# Patient Record
Sex: Female | Born: 1965
Health system: Southern US, Community
[De-identification: ages and names within clinical notes are randomized; demographics above are authoritative.]

## PROBLEM LIST (undated history)

## (undated) DIAGNOSIS — I10 Essential (primary) hypertension: Secondary | ICD-10-CM

## (undated) DIAGNOSIS — C801 Malignant (primary) neoplasm, unspecified: Secondary | ICD-10-CM

## (undated) HISTORY — PX: BREAST SURGERY: SHX581

---

## 1998-12-17 ENCOUNTER — Emergency Department (HOSPITAL_COMMUNITY): Admission: EM | Admit: 1998-12-17 | Discharge: 1998-12-17 | Payer: Self-pay | Admitting: *Deleted

## 1998-12-17 ENCOUNTER — Encounter: Payer: Self-pay | Admitting: *Deleted

## 1999-01-06 ENCOUNTER — Emergency Department (HOSPITAL_COMMUNITY): Admission: EM | Admit: 1999-01-06 | Discharge: 1999-01-06 | Payer: Self-pay | Admitting: Emergency Medicine

## 2000-11-04 ENCOUNTER — Emergency Department (HOSPITAL_COMMUNITY): Admission: EM | Admit: 2000-11-04 | Discharge: 2000-11-04 | Payer: Self-pay | Admitting: Emergency Medicine

## 2001-06-27 ENCOUNTER — Emergency Department (HOSPITAL_COMMUNITY): Admission: EM | Admit: 2001-06-27 | Discharge: 2001-06-27 | Payer: Self-pay | Admitting: *Deleted

## 2001-11-06 ENCOUNTER — Emergency Department (HOSPITAL_COMMUNITY): Admission: EM | Admit: 2001-11-06 | Discharge: 2001-11-06 | Payer: Self-pay | Admitting: Emergency Medicine

## 2003-04-27 ENCOUNTER — Emergency Department (HOSPITAL_COMMUNITY): Admission: EM | Admit: 2003-04-27 | Discharge: 2003-04-27 | Payer: Self-pay | Admitting: Emergency Medicine

## 2003-05-23 ENCOUNTER — Encounter: Payer: Self-pay | Admitting: Surgery

## 2003-05-23 ENCOUNTER — Encounter (HOSPITAL_COMMUNITY): Admission: RE | Admit: 2003-05-23 | Discharge: 2003-08-21 | Payer: Self-pay | Admitting: Surgery

## 2003-05-24 ENCOUNTER — Encounter: Payer: Self-pay | Admitting: Surgery

## 2003-05-30 ENCOUNTER — Encounter: Admission: RE | Admit: 2003-05-30 | Discharge: 2003-05-30 | Payer: Self-pay | Admitting: Surgery

## 2003-05-30 ENCOUNTER — Encounter: Payer: Self-pay | Admitting: Surgery

## 2003-05-31 ENCOUNTER — Encounter: Payer: Self-pay | Admitting: Surgery

## 2003-05-31 ENCOUNTER — Ambulatory Visit (HOSPITAL_BASED_OUTPATIENT_CLINIC_OR_DEPARTMENT_OTHER): Admission: RE | Admit: 2003-05-31 | Discharge: 2003-06-01 | Payer: Self-pay | Admitting: Surgery

## 2003-05-31 ENCOUNTER — Encounter (INDEPENDENT_AMBULATORY_CARE_PROVIDER_SITE_OTHER): Payer: Self-pay | Admitting: Specialist

## 2003-06-29 ENCOUNTER — Ambulatory Visit: Admission: RE | Admit: 2003-06-29 | Discharge: 2003-07-15 | Payer: Self-pay | Admitting: Radiation Oncology

## 2005-08-10 ENCOUNTER — Inpatient Hospital Stay (HOSPITAL_COMMUNITY): Admission: AD | Admit: 2005-08-10 | Discharge: 2005-08-10 | Payer: Self-pay | Admitting: Obstetrics & Gynecology

## 2006-08-26 ENCOUNTER — Inpatient Hospital Stay (HOSPITAL_COMMUNITY): Admission: AD | Admit: 2006-08-26 | Discharge: 2006-08-26 | Payer: Self-pay | Admitting: Gynecology

## 2006-11-03 ENCOUNTER — Emergency Department (HOSPITAL_COMMUNITY): Admission: EM | Admit: 2006-11-03 | Discharge: 2006-11-03 | Payer: Self-pay | Admitting: Emergency Medicine

## 2008-08-28 ENCOUNTER — Emergency Department (HOSPITAL_COMMUNITY): Admission: EM | Admit: 2008-08-28 | Discharge: 2008-08-28 | Payer: Self-pay | Admitting: Emergency Medicine

## 2008-12-03 ENCOUNTER — Emergency Department (HOSPITAL_COMMUNITY): Admission: EM | Admit: 2008-12-03 | Discharge: 2008-12-04 | Payer: Self-pay | Admitting: Emergency Medicine

## 2010-10-06 ENCOUNTER — Encounter: Payer: Self-pay | Admitting: Obstetrics

## 2011-01-31 NOTE — Op Note (Signed)
Tammie Pham, Tammie Pham                        ACCOUNT NO.:  [id number]   MEDICAL RECORD NO.:  [id number]                   PATIENT TYPE:  AMB   LOCATION:  DSC                                  FACILITY:  [facility]   PHYSICIAN:  Thornton Park. Daphine Deutscher, M.D.             DATE OF BIRTH:  22-Oct-1965   DATE OF PROCEDURE:  05/31/2003  DATE OF DISCHARGE:                                 OPERATIVE REPORT   CCS [phone]   PREOPERATIVE DIAGNOSIS:  Carcinoma of the right breast.   POSTOPERATIVE DIAGNOSIS:  Carcinoma of the right breast.   OPERATION PERFORMED:  Right axillary mapping and sentinel lymph node  biopsies (two), right breast quadrantectomy.   SURGEON:  Thornton Park. Daphine Deutscher, M.D.   ANESTHESIA:  General.   DESCRIPTION OF PROCEDURE:  Erby Pian underwent injection with  radionuclide at [facility] on the right breast.  She has previously  had a core biopsy by Dr. Jeralyn Ruths which proved to be invasive  adenocarcinoma and a palpable lesion in the right breast at approximately  the 12 o'clock position.  The scab from her previous core biopsy which lay  pretty much over this lesion was still visible and was marked  preoperatively.  In addition, you could feel the mass within the right  breast.  The breast was then mapped using the Neoprobe and there was a hot  spot seen along the lateral border of the pectoralis major muscle.  I then  injected beneath the nipple with a little Lymphazurin blue.   After prepping and draping with Betadine and sterile towels, I made an  incision about the lower portion of her hairline in the right axilla and  went in and found two sentinel nodes. These were identified, isolated and  the lymphatic and vascular attachments were carefully clipped with Hemoclips  and they were removed and proved to be blue and hot.  One was more inferior  and superficial.  The other was higher in the axilla and deeper.  Each had  counts over 2000 and 3000 and in the  background, remnant noise was in the  100 to 200 range.   I then put a moist lap pad in the axilla and went up and performed the  quadrantectomy.  I outlined an elliptical incision that included skin from  the previous biopsy in the middle as well as the core biopsy site.  I  carried this through the dermis and then raised flaps superiorly and  inferiorly, medial and lateral and went down to the chest wall.  Inferiorly,  I went down below the nipple or to the level in the retroareolar space and  got a generous quadrantectomy size biopsy down to the chest wall.  This  seemed to completely encompass the lesion and more particularly the medial  radial nippleward margin.  I marked it with metallic markers to demonstrate  its location.  I sent it for  specimen mammography by Dr. Yolanda Bonine which  showed the lesion was well within the mammogram and then this was sent for  permanent sections.   In the meantime, I irrigated with saline.  I changed my gloves.  I instilled  some lidocaine into all of the wounds and enclosed them with 4-0 Vicryl  subcutaneously with benzoin and Steri-Strips on the skin.  The patient  seemed to tolerate the procedure well and was taken to the recovery room in  satisfactory condition.  She will be kept for overnight observation.   FINAL DIAGNOSIS:  Invasive carcinoma of the right breast, status post  sentinel lymph node mapping and right breast quadrantectomy.                                               Thornton Park Daphine Deutscher, M.D.    MBM/MEDQ  D:  05/31/2003  T:  05/31/2003  Job:  [id number]   cc:   Renaye Rakers, M.D.  [phone] [address], [address]  [place]  [place] [address]  Fax: [phone]   Dr. Jeralyn Ruths

## 2012-08-11 ENCOUNTER — Emergency Department (HOSPITAL_COMMUNITY): Payer: No Typology Code available for payment source

## 2012-08-11 ENCOUNTER — Encounter (HOSPITAL_COMMUNITY): Payer: Self-pay | Admitting: Neurology

## 2012-08-11 ENCOUNTER — Emergency Department (HOSPITAL_COMMUNITY)
Admission: EM | Admit: 2012-08-11 | Discharge: 2012-08-11 | Disposition: A | Discharge status: Home / Self Care | Payer: No Typology Code available for payment source | Attending: Emergency Medicine | Admitting: Emergency Medicine

## 2012-08-11 DIAGNOSIS — S0990XA Unspecified injury of head, initial encounter: Secondary | ICD-10-CM

## 2012-08-11 DIAGNOSIS — S139XXA Sprain of joints and ligaments of unspecified parts of neck, initial encounter: Secondary | ICD-10-CM | POA: Insufficient documentation

## 2012-08-11 DIAGNOSIS — S0993XA Unspecified injury of face, initial encounter: Secondary | ICD-10-CM | POA: Insufficient documentation

## 2012-08-11 DIAGNOSIS — S161XXA Strain of muscle, fascia and tendon at neck level, initial encounter: Secondary | ICD-10-CM

## 2012-08-11 DIAGNOSIS — Y93I9 Activity, other involving external motion: Secondary | ICD-10-CM | POA: Insufficient documentation

## 2012-08-11 DIAGNOSIS — R42 Dizziness and giddiness: Secondary | ICD-10-CM | POA: Insufficient documentation

## 2012-08-11 DIAGNOSIS — R51 Headache: Secondary | ICD-10-CM | POA: Insufficient documentation

## 2012-08-11 DIAGNOSIS — S20219A Contusion of unspecified front wall of thorax, initial encounter: Secondary | ICD-10-CM | POA: Insufficient documentation

## 2012-08-11 DIAGNOSIS — S335XXA Sprain of ligaments of lumbar spine, initial encounter: Secondary | ICD-10-CM | POA: Insufficient documentation

## 2012-08-11 DIAGNOSIS — Y92410 Unspecified street and highway as the place of occurrence of the external cause: Secondary | ICD-10-CM | POA: Insufficient documentation

## 2012-08-11 DIAGNOSIS — S39012A Strain of muscle, fascia and tendon of lower back, initial encounter: Secondary | ICD-10-CM

## 2012-08-11 DIAGNOSIS — IMO0002 Reserved for concepts with insufficient information to code with codable children: Secondary | ICD-10-CM | POA: Insufficient documentation

## 2012-08-11 HISTORY — DX: Malignant (primary) neoplasm, unspecified: C80.1

## 2012-08-11 MED ORDER — ONDANSETRON 4 MG PO TBDP
8.0000 mg | ORAL_TABLET | Freq: Once | ORAL | Status: AC
  Administered 2012-08-11: 8 mg via ORAL
  Filled 2012-08-11: qty 2

## 2012-08-11 MED ORDER — IBUPROFEN 200 MG PO TABS
600.0000 mg | ORAL_TABLET | Freq: Once | ORAL | Status: AC
  Administered 2012-08-11: 600 mg via ORAL
  Filled 2012-08-11: qty 3

## 2012-08-11 MED ORDER — NAPROXEN 500 MG PO TABS: 500.0000 mg | ORAL_TABLET | Freq: Two times a day (BID) | ORAL | Status: DC

## 2012-08-11 MED ORDER — HYDROCODONE-ACETAMINOPHEN 5-500 MG PO TABS: 1.0000 | ORAL_TABLET | Freq: Four times a day (QID) | ORAL | Status: DC | PRN

## 2012-08-11 MED ORDER — CYCLOBENZAPRINE HCL 10 MG PO TABS: 10.0000 mg | ORAL_TABLET | Freq: Two times a day (BID) | ORAL | Status: DC | PRN

## 2012-08-11 MED ORDER — HYDROCODONE-ACETAMINOPHEN 5-325 MG PO TABS
1.0000 | ORAL_TABLET | Freq: Once | ORAL | Status: AC
  Administered 2012-08-11: 1 via ORAL
  Filled 2012-08-11: qty 1
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
## 2012-08-11 NOTE — ED Notes (Signed)
Pt reporting nausea. Sprite and crackers provided. PA made aware.
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
## 2012-08-12 NOTE — ED Provider Notes (Signed)
Medical screening examination/treatment/procedure(s) were performed by non-physician practitioner and as supervising physician I was immediately available for consultation/collaboration.   Hurman Horn, MD 08/12/12 [phone]

## 2013-11-09 ENCOUNTER — Telehealth: Payer: Self-pay

## 2013-11-09 NOTE — Telephone Encounter (Signed)
Called and spoke to patient and asked her about appt with Dr.Yan Patient states she is not going to any appt right now. I asked to speak to her husband she told me he was sleeping.

## 2013-11-28 ENCOUNTER — Emergency Department (HOSPITAL_COMMUNITY)
Admission: EM | Admit: 2013-11-28 | Discharge: 2013-11-28 | Disposition: A | Discharge status: Home / Self Care | Payer: BC Managed Care – PPO | Attending: Emergency Medicine | Admitting: Emergency Medicine

## 2013-11-28 ENCOUNTER — Encounter (HOSPITAL_COMMUNITY): Payer: Self-pay | Admitting: Emergency Medicine

## 2013-11-28 DIAGNOSIS — R51 Headache: Secondary | ICD-10-CM

## 2013-11-28 DIAGNOSIS — J329 Chronic sinusitis, unspecified: Secondary | ICD-10-CM | POA: Insufficient documentation

## 2013-11-28 DIAGNOSIS — Z859 Personal history of malignant neoplasm, unspecified: Secondary | ICD-10-CM | POA: Insufficient documentation

## 2013-11-28 DIAGNOSIS — H6120 Impacted cerumen, unspecified ear: Secondary | ICD-10-CM | POA: Insufficient documentation

## 2013-11-28 DIAGNOSIS — Z79899 Other long term (current) drug therapy: Secondary | ICD-10-CM | POA: Insufficient documentation

## 2013-11-28 DIAGNOSIS — R519 Headache, unspecified: Secondary | ICD-10-CM

## 2013-11-28 DIAGNOSIS — I10 Essential (primary) hypertension: Secondary | ICD-10-CM | POA: Insufficient documentation

## 2013-11-28 DIAGNOSIS — Z3202 Encounter for pregnancy test, result negative: Secondary | ICD-10-CM | POA: Insufficient documentation

## 2013-11-28 DIAGNOSIS — R3915 Urgency of urination: Secondary | ICD-10-CM | POA: Insufficient documentation

## 2013-11-28 HISTORY — DX: Essential (primary) hypertension: I10

## 2013-11-28 LAB — URINALYSIS, ROUTINE W REFLEX MICROSCOPIC
Bilirubin Urine: NEGATIVE
Glucose, UA: NEGATIVE mg/dL
Hgb urine dipstick: NEGATIVE
Ketones, ur: NEGATIVE mg/dL
Leukocytes, UA: NEGATIVE
Nitrite: NEGATIVE
Protein, ur: NEGATIVE mg/dL
Specific Gravity, Urine: 1.013 (ref 1.005–1.030)
Urobilinogen, UA: 0.2 mg/dL (ref 0.0–1.0)
pH: 7.5 (ref 5.0–8.0)

## 2013-11-28 LAB — CBC
HCT: 36.6 % (ref 36.0–46.0)
Hemoglobin: 12.5 g/dL (ref 12.0–15.0)
MCH: 31.3 pg (ref 26.0–34.0)
MCHC: 34.2 g/dL (ref 30.0–36.0)
MCV: 91.7 fL (ref 78.0–100.0)
Platelets: 417 10*3/uL — ABNORMAL HIGH (ref 150–400)
RBC: 3.99 MIL/uL (ref 3.87–5.11)
RDW: 13.4 % (ref 11.5–15.5)
WBC: 5.4 10*3/uL (ref 4.0–10.5)

## 2013-11-28 LAB — BASIC METABOLIC PANEL
BUN: 9 mg/dL (ref 6–23)
CO2: 27 meq/L (ref 19–32)
Calcium: 9.7 mg/dL (ref 8.4–10.5)
Chloride: 98 meq/L (ref 96–112)
Creatinine, Ser: 0.64 mg/dL (ref 0.50–1.10)
GFR calc Af Amer: >90 mL/min (ref >90)
GFR calc non Af Amer: >90 mL/min (ref >90)
Glucose, Bld: 109 mg/dL — ABNORMAL HIGH (ref 70–99)
Potassium: 3.9 meq/L (ref 3.7–5.3)
Sodium: 137 meq/L (ref 137–147)

## 2013-11-28 LAB — POC URINE PREG, ED: Preg Test, Ur: NEGATIVE

## 2013-11-28 MED ORDER — IBUPROFEN 800 MG PO TABS: 800.0000 mg | ORAL_TABLET | Freq: Three times a day (TID) | ORAL | Status: DC

## 2013-11-28 MED ORDER — SODIUM CHLORIDE 0.9 % IV BOLUS (SEPSIS)
500.0000 mL | Freq: Once | INTRAVENOUS | Status: AC
  Administered 2013-11-28: 500 mL via INTRAVENOUS

## 2013-11-28 MED ORDER — KETOROLAC TROMETHAMINE 30 MG/ML IJ SOLN
30.0000 mg | Freq: Once | INTRAMUSCULAR | Status: AC
  Administered 2013-11-28: 30 mg via INTRAVENOUS
  Filled 2013-11-28: qty 1

## 2013-11-28 MED ORDER — DEXAMETHASONE SODIUM PHOSPHATE 10 MG/ML IJ SOLN
10.0000 mg | Freq: Once | INTRAMUSCULAR | Status: AC
  Administered 2013-11-28: 10 mg via INTRAVENOUS
  Filled 2013-11-28: qty 1

## 2013-11-28 MED ORDER — KETOROLAC TROMETHAMINE 30 MG/ML IJ SOLN: 15.0000 mg | Freq: Once | INTRAMUSCULAR | Status: DC

## 2013-11-28 MED ORDER — FLUTICASONE PROPIONATE 50 MCG/ACT NA SUSP: 2.0000 | Freq: Every day | NASAL | Status: DC

## 2013-11-28 MED ORDER — OXYMETAZOLINE HCL 0.05 % NA SOLN: 1.0000 | Freq: Two times a day (BID) | NASAL | Status: DC

## 2013-11-28 NOTE — Discharge Instructions (Signed)
Call for a follow up appointment with a Family or Primary Care Provider for further evaluation of your hypertension, and cerumen impaction (ear wax build up). Follow up with Dr. Krista Blue, the neurologist for further evaluation of your headaches. Monitor your Blood Pressure at home. Debrox (over the counter) for ear wax after you see your PCP for removal. Return if Symptoms worsen.   Take medication as prescribed.

## 2013-11-28 NOTE — ED Notes (Addendum)
Pt reports headache that started this morning, pt sts she then went to [facility] To have her blood pressure checked and they told her it was high. Pt sts she was recently diagnosed with hypertension and is taking medications but is still having headaches every day. Pt also sts she thinks she could be having kidney infection because in January she was told she is having blood in urine.

## 2013-11-28 NOTE — ED Provider Notes (Signed)
CSN: [id number]     Arrival date & time 11/28/13  1020 History   First MD Initiated Contact with Patient 11/28/13 1101     Chief Complaint  Patient presents with  . Headache     (Consider location/radiation/quality/duration/timing/severity/associated sxs/prior Treatment) HPI Comments: Tammie Pham is a 48 y.o. female with a past medical history of HTN presenting the Emergency Department with a chief complaint of headache.  The patient reports a gradual onset of frontal headache since this morning.  She describes the discomfort as pressure, rated 9/10.  She reports similar headaches over the past several months, they are aggravated by leaning her head forward or backShe denies visual changes, photophobia, numbness, tingling, lightheadedness, dizziness, rash, or fever.She also reports urinary urgency for several days, denies dysuria, hematuria, abdominal pain, nausea, vomiting.  Reports recent diagnosis of HTN and reports compliance with Lasix and Norvasc.  Does not monitor BP at home and does not know her baseline BP. PCP: Lindwood Qua  Patient is a 48 y.o. female presenting with headaches. The history is provided by the patient and medical records. No language interpreter was used.  Headache Associated symptoms: congestion and sinus pressure   Associated symptoms: no abdominal pain, no diarrhea, no dizziness, no fever, no nausea, no numbness, no photophobia and no vomiting     Past Medical History  Diagnosis Date  . Cancer   . Hypertension    Past Surgical History  Procedure Laterality Date  . Breast surgery     No family history on file. History  Substance Use Topics  . Smoking status: Never Smoker   . Smokeless tobacco: Not on file  . Alcohol Use: No   OB History   Grav Para Term Preterm Abortions TAB SAB Ect Mult Living                 Review of Systems  Constitutional: Negative for fever and chills.  HENT: Positive for congestion and sinus pressure.   Eyes: Negative  for photophobia and visual disturbance.  Gastrointestinal: Negative for nausea, vomiting, abdominal pain and diarrhea.  Genitourinary: Positive for urgency. Negative for dysuria, hematuria and flank pain.  Skin: Negative for rash.  Neurological: Positive for headaches. Negative for dizziness, syncope, weakness, light-headedness and numbness.  All other systems reviewed and are negative.      Allergies  Codeine  Home Medications   Current Outpatient Rx  Name  Route  Sig  Dispense  Refill  . amLODipine (NORVASC) 10 MG tablet   Oral   Take 10 mg by mouth daily.         . furosemide (LASIX) 20 MG tablet   Oral   Take 20 mg by mouth daily.         . Multiple Vitamin (MULTIVITAMIN WITH MINERALS) TABS tablet   Oral   Take 1 tablet by mouth daily.          BP 138/107  Pulse 117  Temp(Src) 98.3 F (36.8 C) (Oral)  Resp 20  SpO2 99%  LMP 11/14/2013 Physical Exam  Nursing note and vitals reviewed. Constitutional: She is oriented to person, place, and time. She appears well-developed and well-nourished. She is active.  Non-toxic appearance. She does not have a sickly appearance. No distress.  HENT:  Head: Normocephalic and atraumatic.  Nose: Rhinorrhea present. Right sinus exhibits frontal sinus tenderness. Left sinus exhibits frontal sinus tenderness.  Mouth/Throat: Oropharynx is clear and moist.  Cerumen impaction bilaterally.  Unable to visualize TMs.  Eyes:  Conjunctivae and EOM are normal. Pupils are equal, round, and reactive to light. No scleral icterus.  Neck: Normal range of motion. Neck supple. No rigidity.  Cardiovascular: Normal rate, regular rhythm and normal heart sounds.   Pulmonary/Chest: Effort normal and breath sounds normal. No respiratory distress. She has no decreased breath sounds. She has no wheezes. She has no rales.  Abdominal: Soft. Bowel sounds are normal. There is no tenderness. There is no rebound and no guarding.  Musculoskeletal: Normal range  of motion. She exhibits no edema.  Lymphadenopathy:       Head (right side): No submental, no submandibular, no tonsillar, no preauricular, no posterior auricular and no occipital adenopathy present.       Head (left side): No submental, no submandibular, no tonsillar, no preauricular, no posterior auricular and no occipital adenopathy present.    She has no cervical adenopathy.  Neurological: She is alert and oriented to person, place, and time. No cranial nerve deficit or sensory deficit. She exhibits normal muscle tone. Coordination normal. GCS eye subscore is 4. GCS verbal subscore is 5. GCS motor subscore is 6.  Speech is clear and goal oriented, follows commands Cranial nerves III - XII grossly intact, no facial droop Normal strength in upper and lower extremities bilaterally, strong and equal grip strength Sensation normal to light touch Moves all 4 extremities without ataxia, normal gait, coordination intact     Skin: Skin is warm and dry. No rash noted. She is not diaphoretic.  Psychiatric: She has a normal mood and affect. Her behavior is normal. Thought content normal.    ED Course  Procedures (including critical care time) Labs Review Labs Reviewed  CBC - Abnormal; Notable for the following:    Platelets 417 (*)    All other components within normal limits  BASIC METABOLIC PANEL - Abnormal; Notable for the following:    Glucose, Bld 109 (*)    All other components within normal limits  URINALYSIS, ROUTINE W REFLEX MICROSCOPIC  POC URINE PREG, ED   Imaging Review No results found.   EKG Interpretation None      MDM   Final diagnoses:  Headache  Sinusitis  Hypertension  Cerumen impaction   Pt presents with gradual onset of headache, similar to previous HA in the past. No neuro focal deficits on exam. PE shows sinus tenderness to palpation, cerumen impaction.  Hypertension on arrival  160/116.  EMR shows patient has been referred to Neurology for further  evaluation of her ongoing headaches. 1459: BP 138/107 Labs WNL.  Headache likely sinusitis.  Re-eval pt resting comfortably in room watching TV pt reports symptom improvement requesting food.  Encouraged monitoring BP at home and following up with her PCP for further management. 4782: Re-eval pt watching TV in room eating sandwich reports 6/10 discomfort. Decadron ordered. 1655: Pt care signed out to Pt-care assumed by Jeannett Senior, PA-C.  Meds given in ED:  Medications  dexamethasone (DECADRON) injection 10 mg (not administered)  sodium chloride 0.9 % bolus 500 mL (500 mLs Intravenous New Bag/Given 11/28/13 1458)  ketorolac (TORADOL) 30 MG/ML injection 30 mg (30 mg Intravenous Given 11/28/13 1517)    New Prescriptions   FLUTICASONE (FLONASE) 50 MCG/ACT NASAL SPRAY    Place 2 sprays into both nostrils daily.   IBUPROFEN (ADVIL,MOTRIN) 800 MG TABLET    Take 1 tablet (800 mg total) by mouth 3 (three) times daily. Take with meals   OXYMETAZOLINE (AFRIN NASAL SPRAY) 0.05 % NASAL SPRAY  Place 1 spray into both nostrils 2 (two) times daily.       Lorrine Kin, PA-C 11/28/13 [place], PA-C 11/28/13 [phone]

## 2013-11-28 NOTE — ED Provider Notes (Signed)
Patient signed out to me at shift change. Patient presented to emergency department complaining of headache and elevated blood pressure. Initial blood pressure reading was 160/116. Patient is followed by primary care Dr., and has referral to a neurologist however she has not seen him yet. She reports ongoing headaches over several months. Her exam is unremarkable. Patient was treated emergency department with Toradol IV and Decadron IV. She is feeling much better now. Her headache has resolved. She reports to me some sinus pressure and nasal and ear congestion. On exam her bilateral ears are impacted with cerumen. She does have nasal congestion tenderness over her maxillary sinuses bilaterally. Her exam is otherwise unremarkable. Patient will be discharged home with treatment for sinusitis and close followup with primary care Dr. any urologist. Her blood pressure currently is 138/84, no medications given in emergency department for her blood pressure.  Filed Vitals:   11/28/13 1659  BP: 134/84  Pulse: 73  Temp: 98.7 F (37.1 C)  Resp:      Renold Genta, PA-C 11/28/13 1737

## 2013-11-28 NOTE — ED Provider Notes (Signed)
Medical screening examination/treatment/procedure(s) were performed by non-physician practitioner and as supervising physician I was immediately available for consultation/collaboration.   EKG Interpretation None       Merryl Hacker, MD 11/28/13 [phone]

## 2013-11-30 ENCOUNTER — Other Ambulatory Visit (HOSPITAL_COMMUNITY): Payer: Self-pay | Admitting: Family Medicine

## 2013-11-30 DIAGNOSIS — I499 Cardiac arrhythmia, unspecified: Secondary | ICD-10-CM

## 2013-11-30 DIAGNOSIS — I10 Essential (primary) hypertension: Secondary | ICD-10-CM

## 2013-12-01 ENCOUNTER — Other Ambulatory Visit (HOSPITAL_COMMUNITY): Payer: BC Managed Care – PPO

## 2013-12-07 ENCOUNTER — Other Ambulatory Visit (HOSPITAL_COMMUNITY): Payer: BC Managed Care – PPO

## 2014-08-01 ENCOUNTER — Emergency Department (HOSPITAL_COMMUNITY)
Admission: EM | Admit: 2014-08-01 | Discharge: 2014-08-01 | Disposition: A | Discharge status: Home / Self Care | Payer: BC Managed Care – PPO | Attending: Emergency Medicine | Admitting: Emergency Medicine

## 2014-08-01 DIAGNOSIS — Z859 Personal history of malignant neoplasm, unspecified: Secondary | ICD-10-CM | POA: Insufficient documentation

## 2014-08-01 DIAGNOSIS — R531 Weakness: Secondary | ICD-10-CM | POA: Diagnosis not present

## 2014-08-01 DIAGNOSIS — R55 Syncope and collapse: Secondary | ICD-10-CM | POA: Insufficient documentation

## 2014-08-01 DIAGNOSIS — Z791 Long term (current) use of non-steroidal anti-inflammatories (NSAID): Secondary | ICD-10-CM | POA: Insufficient documentation

## 2014-08-01 DIAGNOSIS — I10 Essential (primary) hypertension: Secondary | ICD-10-CM | POA: Diagnosis not present

## 2014-08-01 DIAGNOSIS — Z79899 Other long term (current) drug therapy: Secondary | ICD-10-CM | POA: Diagnosis not present

## 2014-08-01 DIAGNOSIS — Z7951 Long term (current) use of inhaled steroids: Secondary | ICD-10-CM | POA: Diagnosis not present

## 2014-08-01 LAB — CBC WITH DIFFERENTIAL/PLATELET
Basophils Absolute: 0 10*3/uL (ref 0.0–0.1)
Basophils Relative: 1 % (ref 0–1)
Eosinophils Absolute: 0.2 10*3/uL (ref 0.0–0.7)
Eosinophils Relative: 4 % (ref 0–5)
HCT: 35.8 % — ABNORMAL LOW (ref 36.0–46.0)
Hemoglobin: 11.9 g/dL — ABNORMAL LOW (ref 12.0–15.0)
Lymphocytes Relative: 28 % (ref 12–46)
Lymphs Abs: 1.4 10*3/uL (ref 0.7–4.0)
MCH: 30.3 pg (ref 26.0–34.0)
MCHC: 33.2 g/dL (ref 30.0–36.0)
MCV: 91.1 fL (ref 78.0–100.0)
Monocytes Absolute: 0.6 10*3/uL (ref 0.1–1.0)
Monocytes Relative: 11 % (ref 3–12)
Neutro Abs: 2.9 10*3/uL (ref 1.7–7.7)
Neutrophils Relative %: 56 % (ref 43–77)
Platelets: 331 10*3/uL (ref 150–400)
RBC: 3.93 MIL/uL (ref 3.87–5.11)
RDW: 12.9 % (ref 11.5–15.5)
WBC: 5.2 10*3/uL (ref 4.0–10.5)

## 2014-08-01 LAB — COMPREHENSIVE METABOLIC PANEL
ALT: 10 U/L (ref 0–35)
AST: 14 U/L (ref 0–37)
Albumin: 4.1 g/dL (ref 3.5–5.2)
Alkaline Phosphatase: 70 U/L (ref 39–117)
Anion gap: 14 (ref 5–15)
BUN: 23 mg/dL (ref 6–23)
CO2: 26 mEq/L (ref 19–32)
Calcium: 10.3 mg/dL (ref 8.4–10.5)
Chloride: 98 meq/L (ref 96–112)
Creatinine, Ser: 0.84 mg/dL (ref 0.50–1.10)
GFR calc Af Amer: >90 mL/min (ref >90)
GFR calc non Af Amer: 80 mL/min — ABNORMAL LOW (ref >90)
Glucose, Bld: 98 mg/dL (ref 70–99)
Potassium: 4.1 mEq/L (ref 3.7–5.3)
Sodium: 138 mEq/L (ref 137–147)
Total Bilirubin: 1 mg/dL (ref 0.3–1.2)
Total Protein: 8.7 g/dL — ABNORMAL HIGH (ref 6.0–8.3)

## 2014-08-01 LAB — TSH: TSH: 1.55 u[IU]/mL (ref 0.350–4.500)

## 2014-08-01 LAB — URINALYSIS, ROUTINE W REFLEX MICROSCOPIC
Bilirubin Urine: NEGATIVE
Glucose, UA: NEGATIVE mg/dL
Ketones, ur: NEGATIVE mg/dL
Leukocytes, UA: NEGATIVE
Nitrite: NEGATIVE
Protein, ur: NEGATIVE mg/dL
Specific Gravity, Urine: 1.02 (ref 1.005–1.030)
Urobilinogen, UA: 0.2 mg/dL (ref 0.0–1.0)
pH: 6 (ref 5.0–8.0)

## 2014-08-01 LAB — URINE MICROSCOPIC-ADD ON

## 2014-08-01 NOTE — ED Notes (Signed)
Pt presents with NAD- Pt states " about passed out yesterday" "been having hot flashes for about 3 weeks" EDP Delo evaluated this pt before this Probation officer.

## 2014-08-01 NOTE — ED Notes (Signed)
MD at bedside. 

## 2014-08-01 NOTE — Discharge Instructions (Signed)
Follow-up with your primary Dr. If not improving in the next week and return to the ER if your symptoms substantially worsen or change.   Near-Syncope Near-syncope (commonly known as near fainting) is sudden weakness, dizziness, or feeling like you might pass out. During an episode of near-syncope, you may also develop pale skin, have tunnel vision, or feel sick to your stomach (nauseous). Near-syncope may occur when getting up after sitting or while standing for a long time. It is caused by a sudden decrease in blood flow to the brain. This decrease can result from various causes or triggers, most of which are not serious. However, because near-syncope can sometimes be a sign of something serious, a medical evaluation is required. The specific cause is often not determined. HOME CARE INSTRUCTIONS  Monitor your condition for any changes. The following actions may help to alleviate any discomfort you are experiencing:  Have someone stay with you until you feel stable.  Lie down right away and prop your feet up if you start feeling like you might faint. Breathe deeply and steadily. Wait until all the symptoms have passed. Most of these episodes last only a few minutes. You may feel tired for several hours.   Drink enough fluids to keep your urine clear or pale yellow.   If you are taking blood pressure or heart medicine, get up slowly when seated or lying down. Take several minutes to sit and then stand. This can reduce dizziness.  Follow up with your health care provider as directed. SEEK IMMEDIATE MEDICAL CARE IF:   You have a severe headache.   You have unusual pain in the chest, abdomen, or back.   You are bleeding from the mouth or rectum, or you have black or tarry stool.   You have an irregular or very fast heartbeat.   You have repeated fainting or have seizure-like jerking during an episode.   You faint when sitting or lying down.   You have confusion.   You have  difficulty walking.   You have severe weakness.   You have vision problems.  MAKE SURE YOU:   Understand these instructions.  Will watch your condition.  Will get help right away if you are not doing well or get worse. Document Released: 09/01/2005 Document Revised: 09/06/2013 Document Reviewed: 02/04/2013 [facility] Patient Information 2015 [place], [place]. This information is not intended to replace advice given to you by your health care provider. Make sure you discuss any questions you have with your health care provider.

## 2014-08-01 NOTE — ED Provider Notes (Signed)
CSN: [id number]     Arrival date & time 08/01/14  0719 History   First MD Initiated Contact with Patient 08/01/14 [phone]     No chief complaint on file.    (Consider location/radiation/quality/duration/timing/severity/associated sxs/prior Treatment) HPI Comments: Patient is a 48 year old female with history of hypertension. She presents with complaints of intermittent weakness and dizziness that has been occurring for the past 3 weeks. She reports "hot flashes" and felt this was related to menopause. She is now feeling dizziness and near syncope on occasion and is concerned about this. She denies any fevers or chills. She denies any nausea, vomiting, or diarrhea.  Patient is a 48 y.o. female presenting with weakness. The history is provided by the patient.  Weakness This is a new problem. Episode onset: 3 weeks ago. The problem has been gradually worsening. Pertinent negatives include no chest pain, no abdominal pain, no headaches and no shortness of breath. Nothing aggravates the symptoms. Nothing relieves the symptoms. She has tried nothing for the symptoms. The treatment provided no relief.    Past Medical History  Diagnosis Date  . Cancer   . Hypertension    Past Surgical History  Procedure Laterality Date  . Breast surgery     No family history on file. History  Substance Use Topics  . Smoking status: Never Smoker   . Smokeless tobacco: Not on file  . Alcohol Use: No   OB History    No data available     Review of Systems  Respiratory: Negative for shortness of breath.   Cardiovascular: Negative for chest pain.  Gastrointestinal: Negative for abdominal pain.  Neurological: Positive for weakness. Negative for headaches.  All other systems reviewed and are negative.     Allergies  Codeine  Home Medications   Prior to Admission medications   Medication Sig Start Date End Date Taking? Authorizing Provider  amLODipine (NORVASC) 10 MG tablet Take 10 mg by mouth  daily.    Historical Provider, MD  fluticasone (FLONASE) 50 MCG/ACT nasal spray Place 2 sprays into both nostrils daily. 11/28/13   Harvie Heck, PA-C  furosemide (LASIX) 20 MG tablet Take 20 mg by mouth daily.    Historical Provider, MD  ibuprofen (ADVIL,MOTRIN) 800 MG tablet Take 1 tablet (800 mg total) by mouth 3 (three) times daily. Take with meals 11/28/13   Harvie Heck, PA-C  Multiple Vitamin (MULTIVITAMIN WITH MINERALS) TABS tablet Take 1 tablet by mouth daily.    Historical Provider, MD  oxymetazoline (AFRIN NASAL SPRAY) 0.05 % nasal spray Place 1 spray into both nostrils 2 (two) times daily. 11/28/13   Lauren Parker, PA-C   BP 120/72 mmHg  Pulse 81  Temp(Src) 97.8 F (36.6 C) (Oral)  Resp 16  SpO2 100% Physical Exam  Constitutional: She is oriented to person, place, and time. She appears well-developed and well-nourished. No distress.  HENT:  Head: Normocephalic and atraumatic.  Mouth/Throat: Oropharynx is clear and moist.  Eyes: EOM are normal. Pupils are equal, round, and reactive to light.  Neck: Normal range of motion. Neck supple.  Cardiovascular: Normal rate and regular rhythm.  Exam reveals no gallop and no friction rub.   No murmur heard. Pulmonary/Chest: Effort normal and breath sounds normal. No respiratory distress. She has no wheezes.  Abdominal: Soft. Bowel sounds are normal. She exhibits no distension. There is no tenderness.  Musculoskeletal: Normal range of motion.  Neurological: She is alert and oriented to person, place, and time. No cranial nerve deficit. She exhibits  normal muscle tone. Coordination normal.  Skin: Skin is warm and dry. She is not diaphoretic.  Nursing note and vitals reviewed.   ED Course  Procedures (including critical care time) Labs Review Labs Reviewed  COMPREHENSIVE METABOLIC PANEL  CBC WITH DIFFERENTIAL  TSH  URINALYSIS, ROUTINE W REFLEX MICROSCOPIC    Imaging Review No results found.   EKG Interpretation   Date/Time:   Tuesday August 01 2014 07:43:40 EST Ventricular Rate:  83 PR Interval:  171 QRS Duration: 80 QT Interval:  356 QTC Calculation: 418 R Axis:   75 Text Interpretation:  Sinus rhythm Consider left ventricular hypertrophy  Borderline T abnormalities, anterior leads Confirmed by DELOS  MD, Andyn Sales  ([id number]) on 08/01/2014 9:29:06 AM      MDM   Final diagnoses:  None    Patient is a 48 year old female who presents with complaints of near syncope for the past couple days along with "hot flashes" for the past 3 weeks. Her periods are erratic and she believes she may be going through menopause. Nothing else in the workup suggests a cause. Her electrolytes and blood counts are unremarkable an EKG is normal. A TSH is pending. Nothing appears emergent and I believe she is appropriate for discharge. She is to follow-up with her primary Dr. If not improving in the next week.    Veryl Speak, MD 08/01/14 0930

## 2014-12-18 ENCOUNTER — Encounter (HOSPITAL_COMMUNITY): Payer: Self-pay | Admitting: Emergency Medicine

## 2014-12-18 ENCOUNTER — Emergency Department (HOSPITAL_COMMUNITY): Payer: BLUE CROSS/BLUE SHIELD

## 2014-12-18 ENCOUNTER — Emergency Department (HOSPITAL_COMMUNITY)
Admission: EM | Admit: 2014-12-18 | Discharge: 2014-12-18 | Disposition: A | Discharge status: Home / Self Care | Payer: BLUE CROSS/BLUE SHIELD | Attending: Emergency Medicine | Admitting: Emergency Medicine

## 2014-12-18 DIAGNOSIS — R079 Chest pain, unspecified: Secondary | ICD-10-CM | POA: Diagnosis not present

## 2014-12-18 DIAGNOSIS — I10 Essential (primary) hypertension: Secondary | ICD-10-CM | POA: Diagnosis not present

## 2014-12-18 DIAGNOSIS — Z79899 Other long term (current) drug therapy: Secondary | ICD-10-CM | POA: Diagnosis not present

## 2014-12-18 DIAGNOSIS — Z8589 Personal history of malignant neoplasm of other organs and systems: Secondary | ICD-10-CM | POA: Insufficient documentation

## 2014-12-18 LAB — CBC
HCT: 35.3 % — ABNORMAL LOW (ref 36.0–46.0)
Hemoglobin: 11.8 g/dL — ABNORMAL LOW (ref 12.0–15.0)
MCH: 30.6 pg (ref 26.0–34.0)
MCHC: 33.4 g/dL (ref 30.0–36.0)
MCV: 91.7 fL (ref 78.0–100.0)
Platelets: 366 10*3/uL (ref 150–400)
RBC: 3.85 MIL/uL — ABNORMAL LOW (ref 3.87–5.11)
RDW: 13 % (ref 11.5–15.5)
WBC: 4.8 10*3/uL (ref 4.0–10.5)

## 2014-12-18 LAB — BASIC METABOLIC PANEL
Anion gap: 13 (ref 5–15)
BUN: 12 mg/dL (ref 6–23)
CO2: 27 mmol/L (ref 19–32)
Calcium: 9.9 mg/dL (ref 8.4–10.5)
Chloride: 97 mmol/L (ref 96–112)
Creatinine, Ser: 0.86 mg/dL (ref 0.50–1.10)
GFR calc Af Amer: >90 mL/min (ref >90)
GFR calc non Af Amer: 78 mL/min — ABNORMAL LOW (ref >90)
Glucose, Bld: 112 mg/dL — ABNORMAL HIGH (ref 70–99)
Potassium: 3.2 mmol/L — ABNORMAL LOW (ref 3.5–5.1)
Sodium: 137 mmol/L (ref 135–145)

## 2014-12-18 LAB — I-STAT TROPONIN, ED
Troponin i, poc: 0 ng/mL (ref 0.00–0.08)
Troponin i, poc: 0 ng/mL (ref 0.00–0.08)

## 2014-12-18 LAB — BRAIN NATRIURETIC PEPTIDE: B Natriuretic Peptide: 25.1 pg/mL (ref 0.0–100.0)

## 2014-12-18 LAB — D-DIMER, QUANTITATIVE: D-Dimer, Quant: 0.47 ug/mL-FEU (ref 0.00–0.48)

## 2014-12-18 NOTE — ED Notes (Signed)
Pt had 20G in L AC when went to discharge, was removed, catheter intact, site CDI. Was not documented by person who inserted.

## 2014-12-18 NOTE — Discharge Instructions (Signed)

## 2014-12-18 NOTE — ED Notes (Signed)
Bed: WA12 Expected date:  Expected time:  Means of arrival:  Comments: EMS 

## 2014-12-18 NOTE — ED Provider Notes (Signed)
CSN: [id number]     Arrival date & time 12/18/14  1407 History   First MD Initiated Contact with Patient 12/18/14 1704     Chief Complaint  Patient presents with  . Chest Pain     (Consider location/radiation/quality/duration/timing/severity/associated sxs/prior Treatment) HPI Comments: Patient reports sudden onset of a tightness across the anterior aspect of her upper chest while at home.  No shortness of breath, nausea, diaphoresis.  Does endorse some increased stress at home.  History of well-controlled hypertension.  No family history of early onset heart disease.  Patient is a 49 y.o. female presenting with chest pain. The history is provided by the patient. No language interpreter was used.  Chest Pain Pain location:  L chest and R chest Pain quality: tightness   Pain radiates to the back: no   Onset quality:  Sudden Duration:  10 minutes Progression:  Partially resolved Chronicity:  New Relieved by:  Rest Associated symptoms: no abdominal pain, no cough, no nausea, no palpitations, no shortness of breath and not vomiting     Past Medical History  Diagnosis Date  . Cancer   . Hypertension    Past Surgical History  Procedure Laterality Date  . Breast surgery     History reviewed. No pertinent family history. History  Substance Use Topics  . Smoking status: Never Smoker   . Smokeless tobacco: Not on file  . Alcohol Use: No   OB History    No data available     Review of Systems  Respiratory: Negative for cough and shortness of breath.   Cardiovascular: Positive for chest pain. Negative for palpitations.  Gastrointestinal: Negative for nausea, vomiting and abdominal pain.  All other systems reviewed and are negative.     Allergies  Codeine  Home Medications   Prior to Admission medications   Medication Sig Start Date End Date Taking? Authorizing Provider  amLODipine (NORVASC) 5 MG tablet Take 5 mg by mouth 2 (two) times daily.   Yes Historical Provider,  MD  carvedilol (COREG) 25 MG tablet Take 25 mg by mouth 2 (two) times daily with a meal.   Yes Historical Provider, MD  lisinopril-hydrochlorothiazide (PRINZIDE,ZESTORETIC) 20-25 MG per tablet Take 1 tablet by mouth daily.   Yes Historical Provider, MD  Multiple Vitamin (MULTIVITAMIN WITH MINERALS) TABS tablet Take 1 tablet by mouth daily.   Yes Historical Provider, MD   BP 126/80 mmHg  Pulse 94  Temp(Src) 98.2 F (36.8 C) (Oral)  Resp 13  SpO2 100%  LMP 12/17/2014 Physical Exam  Constitutional: She is oriented to person, place, and time. She appears well-developed and well-nourished.  HENT:  Head: Normocephalic.  Eyes: Pupils are equal, round, and reactive to light.  Neck: Normal range of motion.  Cardiovascular: Normal rate and regular rhythm.   Pulmonary/Chest: Effort normal and breath sounds normal.  Abdominal: Soft. Bowel sounds are normal.  Musculoskeletal: She exhibits no edema or tenderness.  Lymphadenopathy:    She has no cervical adenopathy.  Neurological: She is alert and oriented to person, place, and time.  Skin: Skin is warm and dry.  Psychiatric: She has a normal mood and affect.  Nursing note and vitals reviewed.   ED Course  Procedures (including critical care time) Labs Review Labs Reviewed  CBC - Abnormal; Notable for the following:    RBC 3.85 (*)    Hemoglobin 11.8 (*)    HCT 35.3 (*)    All other components within normal limits  BASIC METABOLIC PANEL -  Abnormal; Notable for the following:    Potassium 3.2 (*)    Glucose, Bld 112 (*)    GFR calc non Af Amer 78 (*)    All other components within normal limits  BRAIN NATRIURETIC PEPTIDE  I-STAT TROPOININ, ED  I-STAT TROPOININ, ED    Imaging Review Dg Chest 2 View  12/18/2014   CLINICAL DATA:  Shortness of breath and chest tightness since 0930 hours, has happened before, history hypertension, breast cancer post lumpectomy  EXAM: CHEST  2 VIEW  COMPARISON:  08/11/2012  FINDINGS: Borderline  enlargement of cardiac silhouette.  Mediastinal contours and pulmonary vascularity normal.  Minimal central peribronchial thickening.  No acute infiltrate, pleural effusion or pneumothorax.  Surgical clips RIGHT axilla.  Minimal biconvex thoracolumbar scoliosis.  IMPRESSION: Minimal bronchitic changes without infiltrate.  Scoliosis.   Electronically Signed   By: Lavonia Dana M.D.   On: 12/18/2014 14:26     EKG Interpretation None      Patient with sudden onset of bilateral anterior chest tightness this morning with mild shortness of breath. Some increase in home stressors.  Radiology and lab results reviewed.  Troponin negative x 2. D-dimer negative.  ECG with new non-specific ST and t wave changes in V1-3  . Mild anemia, started menses yesterday.  Minimal bronchitic changes on chest film, no infiltrate.   MDM   Final diagnoses:  None    Chest pain, low probability of cardiac etiology.  Patient will benefit from an outpatient cardiac stress test.  Her PCP can help facilitate.    Etta Quill, NP 12/19/14 [id number]  Ezequiel Essex, MD 12/19/14 [phone]

## 2014-12-18 NOTE — ED Notes (Signed)
Pt with Hx of breast cancer c/o generalized chest tightness, lightheadedness, and SOB onset this morning.

## 2015-12-24 DIAGNOSIS — E785 Hyperlipidemia, unspecified: Secondary | ICD-10-CM | POA: Diagnosis not present

## 2015-12-24 DIAGNOSIS — I517 Cardiomegaly: Secondary | ICD-10-CM | POA: Diagnosis not present

## 2015-12-24 DIAGNOSIS — I10 Essential (primary) hypertension: Secondary | ICD-10-CM | POA: Diagnosis not present

## 2016-01-28 DIAGNOSIS — I517 Cardiomegaly: Secondary | ICD-10-CM | POA: Diagnosis not present

## 2016-01-28 DIAGNOSIS — I10 Essential (primary) hypertension: Secondary | ICD-10-CM | POA: Diagnosis not present

## 2016-01-28 DIAGNOSIS — E785 Hyperlipidemia, unspecified: Secondary | ICD-10-CM | POA: Diagnosis not present

## 2016-01-28 DIAGNOSIS — R609 Edema, unspecified: Secondary | ICD-10-CM | POA: Diagnosis not present

## 2016-01-31 IMAGING — CR DG CHEST 2V
2 series · 2 of 2 positions shown · non-contrast
Comparison: 08/11/2012

CLINICAL DATA: Shortness of breath and chest tightness since 0430
hours, has happened before, history hypertension, breast cancer post
lumpectomy

EXAM:
CHEST  2 VIEW

[w chest pa]
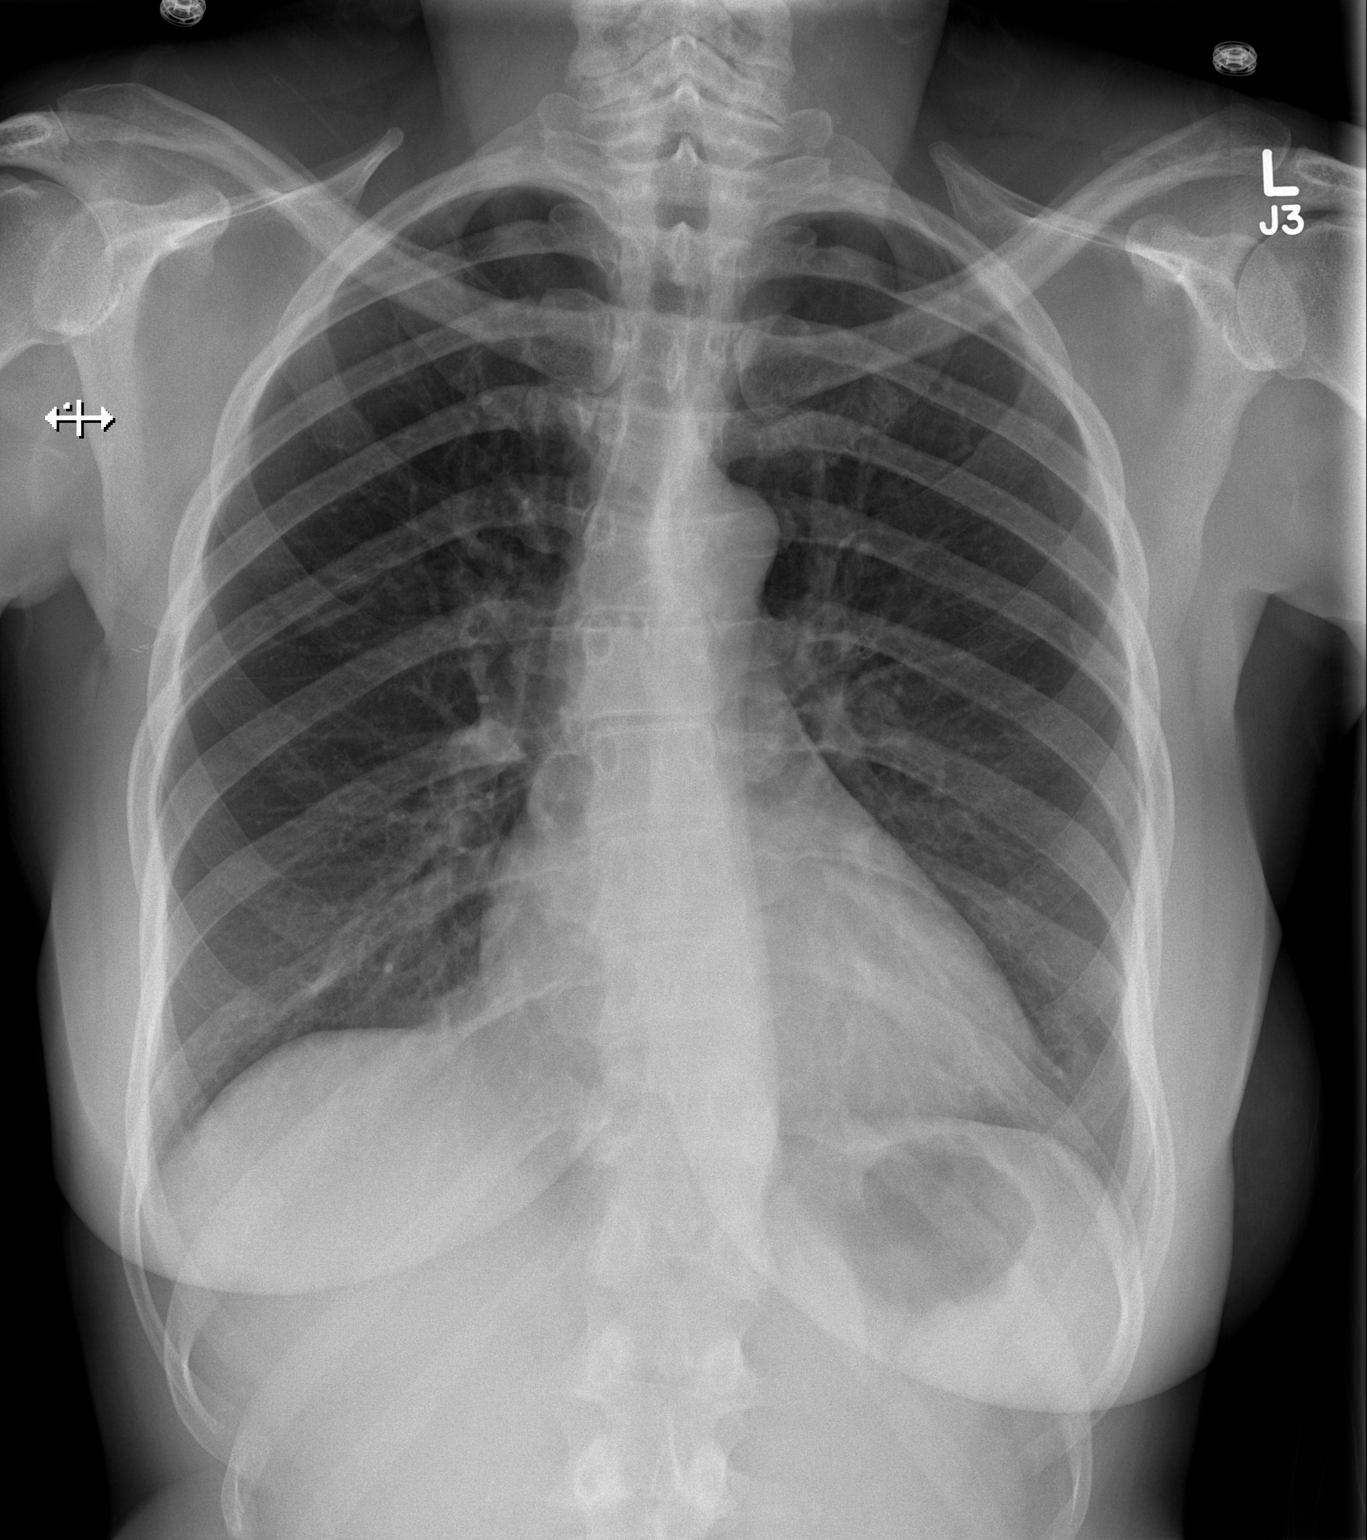

[w chest lat]
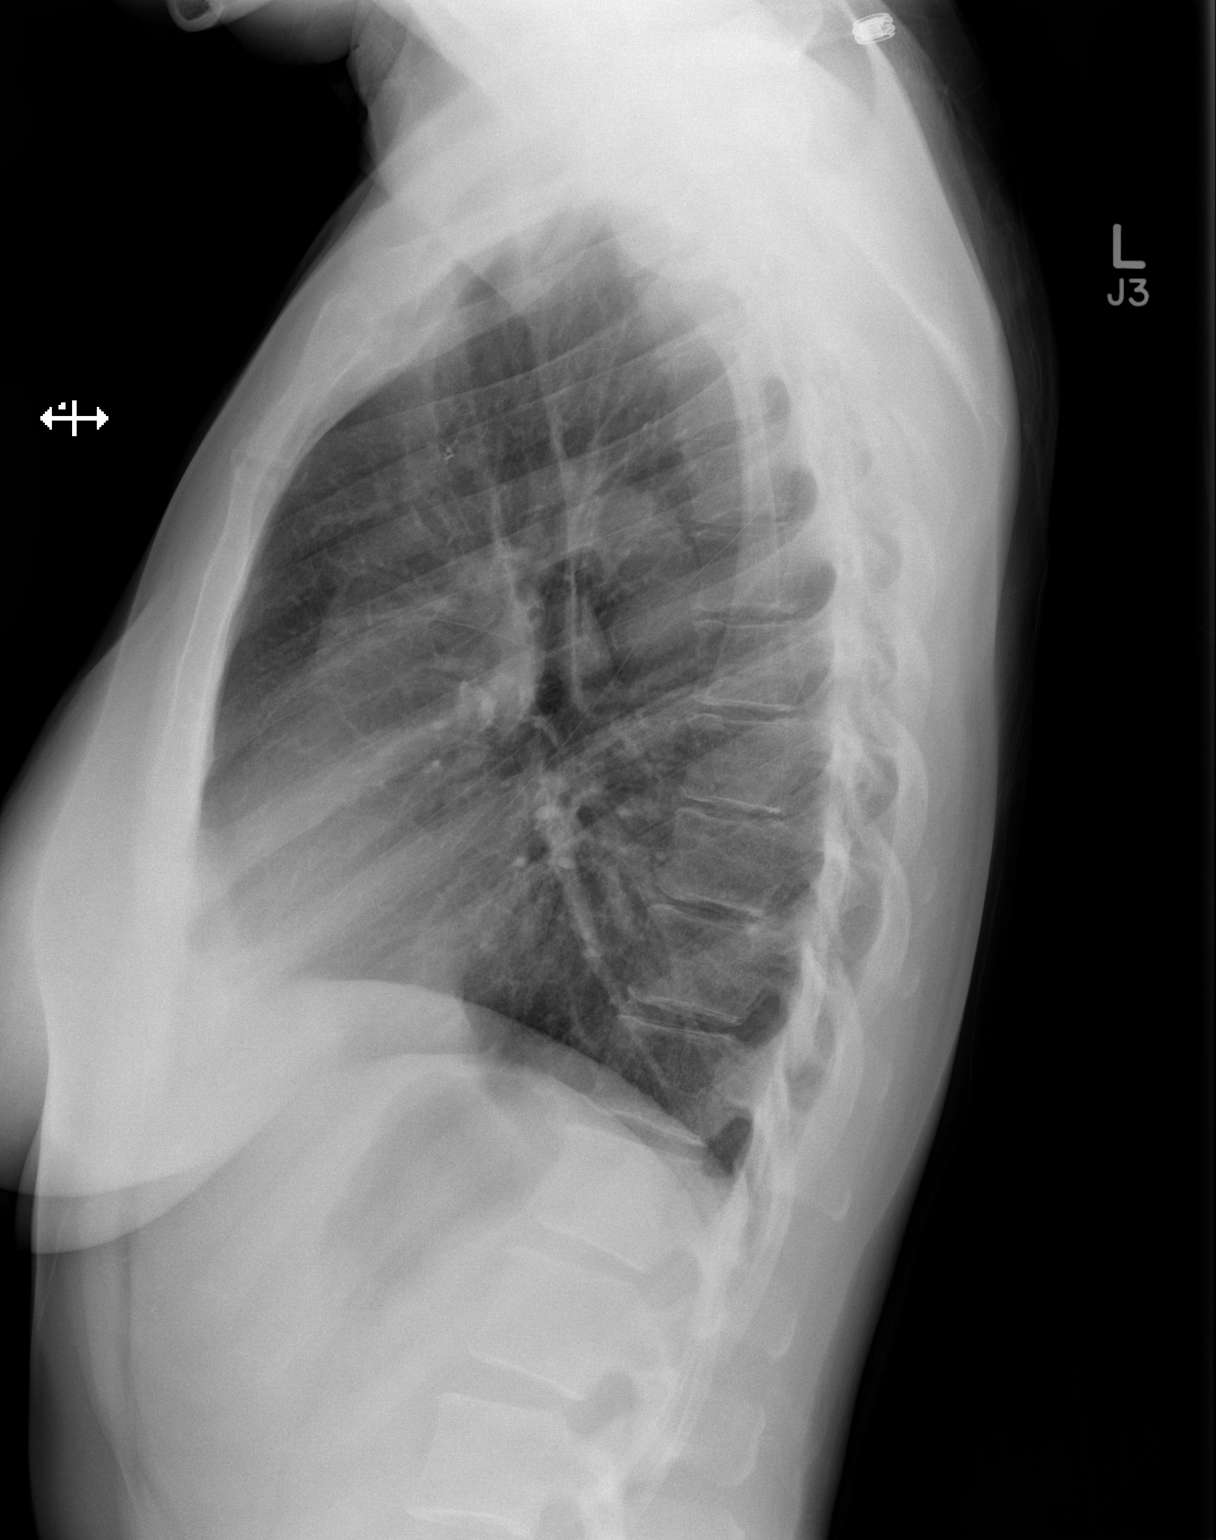

[2 of 2 positions shown; findings below may reference images not displayed]

FINDINGS: Borderline enlargement of cardiac silhouette.

Mediastinal contours and pulmonary vascularity normal.

Minimal central peribronchial thickening.

No acute infiltrate, pleural effusion or pneumothorax.

Surgical clips RIGHT axilla.

Minimal biconvex thoracolumbar scoliosis.
IMPRESSION: Minimal bronchitic changes without infiltrate.

Scoliosis.

## 2016-03-10 DIAGNOSIS — I10 Essential (primary) hypertension: Secondary | ICD-10-CM | POA: Diagnosis not present

## 2016-03-10 DIAGNOSIS — F331 Major depressive disorder, recurrent, moderate: Secondary | ICD-10-CM | POA: Diagnosis not present

## 2016-03-10 DIAGNOSIS — F330 Major depressive disorder, recurrent, mild: Secondary | ICD-10-CM | POA: Diagnosis not present

## 2016-03-24 DIAGNOSIS — E785 Hyperlipidemia, unspecified: Secondary | ICD-10-CM | POA: Diagnosis not present

## 2016-03-24 DIAGNOSIS — I10 Essential (primary) hypertension: Secondary | ICD-10-CM | POA: Diagnosis not present

## 2016-03-24 DIAGNOSIS — I517 Cardiomegaly: Secondary | ICD-10-CM | POA: Diagnosis not present

## 2016-06-30 DIAGNOSIS — E785 Hyperlipidemia, unspecified: Secondary | ICD-10-CM | POA: Diagnosis not present

## 2016-06-30 DIAGNOSIS — I10 Essential (primary) hypertension: Secondary | ICD-10-CM | POA: Diagnosis not present

## 2016-06-30 DIAGNOSIS — I517 Cardiomegaly: Secondary | ICD-10-CM | POA: Diagnosis not present

## 2016-08-11 DIAGNOSIS — I10 Essential (primary) hypertension: Secondary | ICD-10-CM | POA: Diagnosis not present

## 2016-08-11 DIAGNOSIS — E038 Other specified hypothyroidism: Secondary | ICD-10-CM | POA: Diagnosis not present

## 2016-10-22 DIAGNOSIS — E785 Hyperlipidemia, unspecified: Secondary | ICD-10-CM | POA: Diagnosis not present

## 2016-10-22 DIAGNOSIS — I10 Essential (primary) hypertension: Secondary | ICD-10-CM | POA: Diagnosis not present

## 2016-10-22 DIAGNOSIS — I517 Cardiomegaly: Secondary | ICD-10-CM | POA: Diagnosis not present

## 2017-01-28 DIAGNOSIS — I517 Cardiomegaly: Secondary | ICD-10-CM | POA: Diagnosis not present

## 2017-01-28 DIAGNOSIS — E785 Hyperlipidemia, unspecified: Secondary | ICD-10-CM | POA: Diagnosis not present

## 2017-01-28 DIAGNOSIS — I10 Essential (primary) hypertension: Secondary | ICD-10-CM | POA: Diagnosis not present

## 2017-04-27 DIAGNOSIS — E038 Other specified hypothyroidism: Secondary | ICD-10-CM | POA: Diagnosis not present

## 2017-04-27 DIAGNOSIS — J392 Other diseases of pharynx: Secondary | ICD-10-CM | POA: Diagnosis not present

## 2017-04-27 DIAGNOSIS — I10 Essential (primary) hypertension: Secondary | ICD-10-CM | POA: Diagnosis not present

## 2017-04-27 DIAGNOSIS — F330 Major depressive disorder, recurrent, mild: Secondary | ICD-10-CM | POA: Diagnosis not present

## 2017-04-27 DIAGNOSIS — F331 Major depressive disorder, recurrent, moderate: Secondary | ICD-10-CM | POA: Diagnosis not present

## 2017-04-27 DIAGNOSIS — J390 Retropharyngeal and parapharyngeal abscess: Secondary | ICD-10-CM | POA: Diagnosis not present

## 2017-04-29 DIAGNOSIS — I517 Cardiomegaly: Secondary | ICD-10-CM | POA: Diagnosis not present

## 2017-04-29 DIAGNOSIS — I10 Essential (primary) hypertension: Secondary | ICD-10-CM | POA: Diagnosis not present

## 2017-04-29 DIAGNOSIS — E785 Hyperlipidemia, unspecified: Secondary | ICD-10-CM | POA: Diagnosis not present

## 2017-08-27 DIAGNOSIS — I10 Essential (primary) hypertension: Secondary | ICD-10-CM | POA: Diagnosis not present

## 2017-08-27 DIAGNOSIS — E039 Hypothyroidism, unspecified: Secondary | ICD-10-CM | POA: Diagnosis not present

## 2017-10-21 DIAGNOSIS — Z1211 Encounter for screening for malignant neoplasm of colon: Secondary | ICD-10-CM | POA: Diagnosis not present

## 2017-10-21 DIAGNOSIS — C50919 Malignant neoplasm of unspecified site of unspecified female breast: Secondary | ICD-10-CM | POA: Diagnosis not present

## 2017-12-06 ENCOUNTER — Encounter (HOSPITAL_COMMUNITY): Payer: Self-pay

## 2017-12-06 ENCOUNTER — Emergency Department (HOSPITAL_COMMUNITY): Payer: BLUE CROSS/BLUE SHIELD

## 2017-12-06 ENCOUNTER — Emergency Department (HOSPITAL_COMMUNITY)
Admission: EM | Admit: 2017-12-06 | Discharge: 2017-12-06 | Disposition: A | Discharge status: Home / Self Care | Payer: BLUE CROSS/BLUE SHIELD | Attending: Emergency Medicine | Admitting: Emergency Medicine

## 2017-12-06 DIAGNOSIS — R6883 Chills (without fever): Secondary | ICD-10-CM | POA: Insufficient documentation

## 2017-12-06 DIAGNOSIS — Z859 Personal history of malignant neoplasm, unspecified: Secondary | ICD-10-CM | POA: Diagnosis not present

## 2017-12-06 DIAGNOSIS — R51 Headache: Secondary | ICD-10-CM | POA: Diagnosis not present

## 2017-12-06 DIAGNOSIS — R0981 Nasal congestion: Secondary | ICD-10-CM | POA: Diagnosis not present

## 2017-12-06 DIAGNOSIS — Z20828 Contact with and (suspected) exposure to other viral communicable diseases: Secondary | ICD-10-CM | POA: Diagnosis not present

## 2017-12-06 DIAGNOSIS — R059 Cough, unspecified: Secondary | ICD-10-CM

## 2017-12-06 DIAGNOSIS — R05 Cough: Secondary | ICD-10-CM | POA: Diagnosis not present

## 2017-12-06 DIAGNOSIS — R509 Fever, unspecified: Secondary | ICD-10-CM | POA: Diagnosis not present

## 2017-12-06 DIAGNOSIS — I10 Essential (primary) hypertension: Secondary | ICD-10-CM | POA: Diagnosis not present

## 2017-12-06 DIAGNOSIS — J40 Bronchitis, not specified as acute or chronic: Secondary | ICD-10-CM | POA: Insufficient documentation

## 2017-12-06 MED ORDER — DEXAMETHASONE SODIUM PHOSPHATE 10 MG/ML IJ SOLN
10.0000 mg | Freq: Once | INTRAMUSCULAR | Status: AC
  Administered 2017-12-06: 10 mg via INTRAMUSCULAR
  Filled 2017-12-06: qty 1

## 2017-12-06 MED ORDER — AZITHROMYCIN 250 MG PO TABS: 250.0000 mg | ORAL_TABLET | Freq: Every day | ORAL | 0 refills | Status: AC

## 2017-12-06 NOTE — Discharge Instructions (Signed)
It was my pleasure taking care of you today!   Please take all of your antibiotics until finished!  You can take over-the-counter cough syrup (Robitussin) if needed for cough.  Rest, drink plenty of fluids to be sure you are staying hydrated.   Please follow up with your primary doctor for discussion of your diagnoses and further evaluation after today's visit if symptoms persist longer than 7 days; Return to the ER for high fevers, difficulty breathing or other concerning symptoms

## 2017-12-06 NOTE — ED Provider Notes (Signed)
Tammie Pham Provider Note   CSN: [id number] Arrival date & time: 12/06/17  1229     History   Chief Complaint Chief Complaint  Patient presents with  . Chills  . Nasal Congestion  . Cough  . Generalized Body Aches    HPI Tammie Pham is a 52 y.o. female.  The history is provided by the patient and medical records. No language interpreter was used.  Cough  Associated symptoms include chills, headaches and myalgias. Pertinent negatives include no chest pain, no ear pain, no sore throat and no shortness of breath.   Tammie Pham is a 52 y.o. female  who presents to the Emergency Department complaining of productive cough and nasal congestion for 10 days.  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  About 2 days later, she developed generalized body aches, cough and subjective fever/chills.  This lasted about 5 days and then seemed to be improving.  About 3 days ago, her cough and congestion worsened.  She tried taking Tylenol with no improvement.  Denies any shortness of breath or chest pain.   Past Medical History:  Diagnosis Date  . Cancer ([place])   . Hypertension     There are no active problems to display for this patient.   Past Surgical History:  Procedure Laterality Date  . BREAST SURGERY       OB History   None      Home Medications    Prior to Admission medications   Medication Sig Start Date End Date Taking? Authorizing Provider  amLODipine (NORVASC) 5 MG tablet Take 5 mg by mouth 2 (two) times daily.    [provider]  azithromycin (ZITHROMAX) 250 MG tablet Take 1 tablet (250 mg total) by mouth daily. Take first 2 tablets together, then 1 every day until finished. 12/06/17   Harjot Dibello, Ozella Almond, PA-C  carvedilol (COREG) 25 MG tablet Take 25 mg by mouth 2 (two) times daily with a meal.    [provider]  lisinopril-hydrochlorothiazide  (PRINZIDE,ZESTORETIC) 20-25 MG per tablet Take 1 tablet by mouth daily.    [provider]  Multiple Vitamin (MULTIVITAMIN WITH MINERALS) TABS tablet Take 1 tablet by mouth daily.    [provider]    Family History No family history on file.  Social History Social History   Tobacco Use  . Smoking status: Never Smoker  . Smokeless tobacco: Never Used  Substance Use Topics  . Alcohol use: No  . Drug use: No     Allergies   Codeine   Review of Systems Review of Systems  Constitutional: Positive for chills and fever (Subjective).  HENT: Positive for congestion. Negative for ear pain, sore throat and trouble swallowing.   Respiratory: Positive for cough. Negative for shortness of breath.   Cardiovascular: Negative for chest pain.  Gastrointestinal: Negative for abdominal pain, diarrhea, nausea and vomiting.  Musculoskeletal: Positive for myalgias.  Neurological: Positive for headaches. Negative for dizziness, syncope, weakness and light-headedness.     Physical Exam Updated Vital Signs BP (!) 147/91 (BP Location: Right Arm)   Pulse 84   Temp 98.2 F (36.8 C) (Oral)   Resp 16   Ht 5\' 9"  (1.753 m)   Wt 68 kg (150 lb)   LMP 12/17/2014   SpO2 99%   BMI 22.15 kg/m   Physical Exam  Constitutional: She is oriented to person, place, and  time. She appears well-developed and well-nourished. No distress.  HENT:  Head: Normocephalic and atraumatic.  OP with erythema, no exudates or tonsillar hypertrophy. + nasal congestion with mucosal edema.  No focal areas of sinus tenderness.  Neck: Normal range of motion. Neck supple.  No meningeal signs.   Cardiovascular: Normal rate, regular rhythm and normal heart sounds.  Pulmonary/Chest: Effort normal.  Lungs are clear to auscultation bilaterally - no w/r/r  Abdominal: Soft. She exhibits no distension. There is no tenderness.  Musculoskeletal: Normal range of motion.  Neurological: She is alert and oriented to  person, place, and time.  Skin: Skin is warm and dry. She is not diaphoretic.  Nursing note and vitals reviewed.    ED Treatments / Results  Labs (all labs ordered are listed, but only abnormal results are displayed) Labs Reviewed - No data to display  EKG None  Radiology Dg Chest 2 View  Result Date: 12/06/2017 CLINICAL DATA:  Cough, chest congestion, intermittent fever, weakness for the past 10 days. EXAM: CHEST - 2 VIEW COMPARISON:  12/18/2014. FINDINGS: The cardiac silhouette remains borderline enlarged. Mild central peribronchial thickening with mild progression. No airspace consolidation. Minimal thoracic spine degenerative changes. IMPRESSION: Mild bronchitic changes with mild progression. Electronically Signed   By: Claudie Revering M.D.   On: 12/06/2017 14:22    Procedures Procedures (including critical care time)  Medications Ordered in ED Medications  dexamethasone (DECADRON) injection 10 mg (has no administration in time range)     Initial Impression / Assessment and Plan / ED Course  I have reviewed the triage vital signs and the nursing notes.  Pertinent labs & imaging results that were available during my care of the patient were reviewed by me and considered in my medical decision making (see chart for details).    Tammie Pham is a 52 y.o. female who presents to ED for 10 days of cough, congestion. Had + flu exposure, then a few days later had subjective fever, body aches, cough, congestion. Was improving, but a few days ago acutely worsened.    On exam, patient is afebrile, non-toxic appearing with a clear lung exam. Mild rhinorrhea and OP with erythema but no exudates or tonsillar hypertrophy.  CXR with mild bronchitic changes.   Concerned patient may have initially had flu-like-illness which has now led to bacterial infection. Given improvement then acute worsening as well as duration of symptoms, will treat with ABX.  Symptomatic home care instructions  discussed. PCP follow up strongly encouraged if symptoms persist. Reasons to return to ER discussed. All questions answered.   Blood pressure (!) 147/91, pulse 84, temperature 98.2 F (36.8 C), temperature source Oral, resp. rate 16, height 5\' 9"  (1.753 m), weight 68 kg (150 lb), last menstrual period 12/17/2014, SpO2 99 %.   Final Clinical Impressions(s) / ED Diagnoses   Final diagnoses:  Cough  Bronchitis    ED Discharge Orders        Ordered    azithromycin (ZITHROMAX) 250 MG tablet  Daily     12/06/17 1433       Rushawn Capshaw, Ozella Almond, PA-C 12/06/17 1439    Daleen Bo, MD 12/08/17 1016

## 2017-12-06 NOTE — ED Triage Notes (Signed)
Pt presents today with a headache, body aches, chills, cough, and nasal congestion x 10 days. Pt reports having a fever last night. Pt has been taking OTC cold medicine with no relief. Pt is not up to date on her flu shot. Pt states she has been around sick children.

## 2018-01-04 DIAGNOSIS — I10 Essential (primary) hypertension: Secondary | ICD-10-CM | POA: Diagnosis not present

## 2018-01-04 DIAGNOSIS — E782 Mixed hyperlipidemia: Secondary | ICD-10-CM | POA: Diagnosis not present

## 2018-01-04 DIAGNOSIS — F331 Major depressive disorder, recurrent, moderate: Secondary | ICD-10-CM | POA: Diagnosis not present

## 2018-01-04 DIAGNOSIS — R000 Tachycardia, unspecified: Secondary | ICD-10-CM | POA: Diagnosis not present

## 2018-01-04 DIAGNOSIS — E038 Other specified hypothyroidism: Secondary | ICD-10-CM | POA: Diagnosis not present

## 2018-05-06 DIAGNOSIS — I10 Essential (primary) hypertension: Secondary | ICD-10-CM | POA: Diagnosis not present

## 2018-05-06 DIAGNOSIS — F331 Major depressive disorder, recurrent, moderate: Secondary | ICD-10-CM | POA: Diagnosis not present

## 2018-05-06 DIAGNOSIS — E038 Other specified hypothyroidism: Secondary | ICD-10-CM | POA: Diagnosis not present

## 2018-09-02 DIAGNOSIS — E038 Other specified hypothyroidism: Secondary | ICD-10-CM | POA: Diagnosis not present

## 2018-09-02 DIAGNOSIS — F331 Major depressive disorder, recurrent, moderate: Secondary | ICD-10-CM | POA: Diagnosis not present

## 2018-09-02 DIAGNOSIS — I10 Essential (primary) hypertension: Secondary | ICD-10-CM | POA: Diagnosis not present

## 2019-01-19 IMAGING — CR DG CHEST 2V
2 series · 2 of 2 positions shown · non-contrast
Comparison: 12/18/2014.

CLINICAL DATA: Cough, chest congestion, intermittent fever,
weakness for the past 10 days.

EXAM:
CHEST - 2 VIEW

[w chest pa]
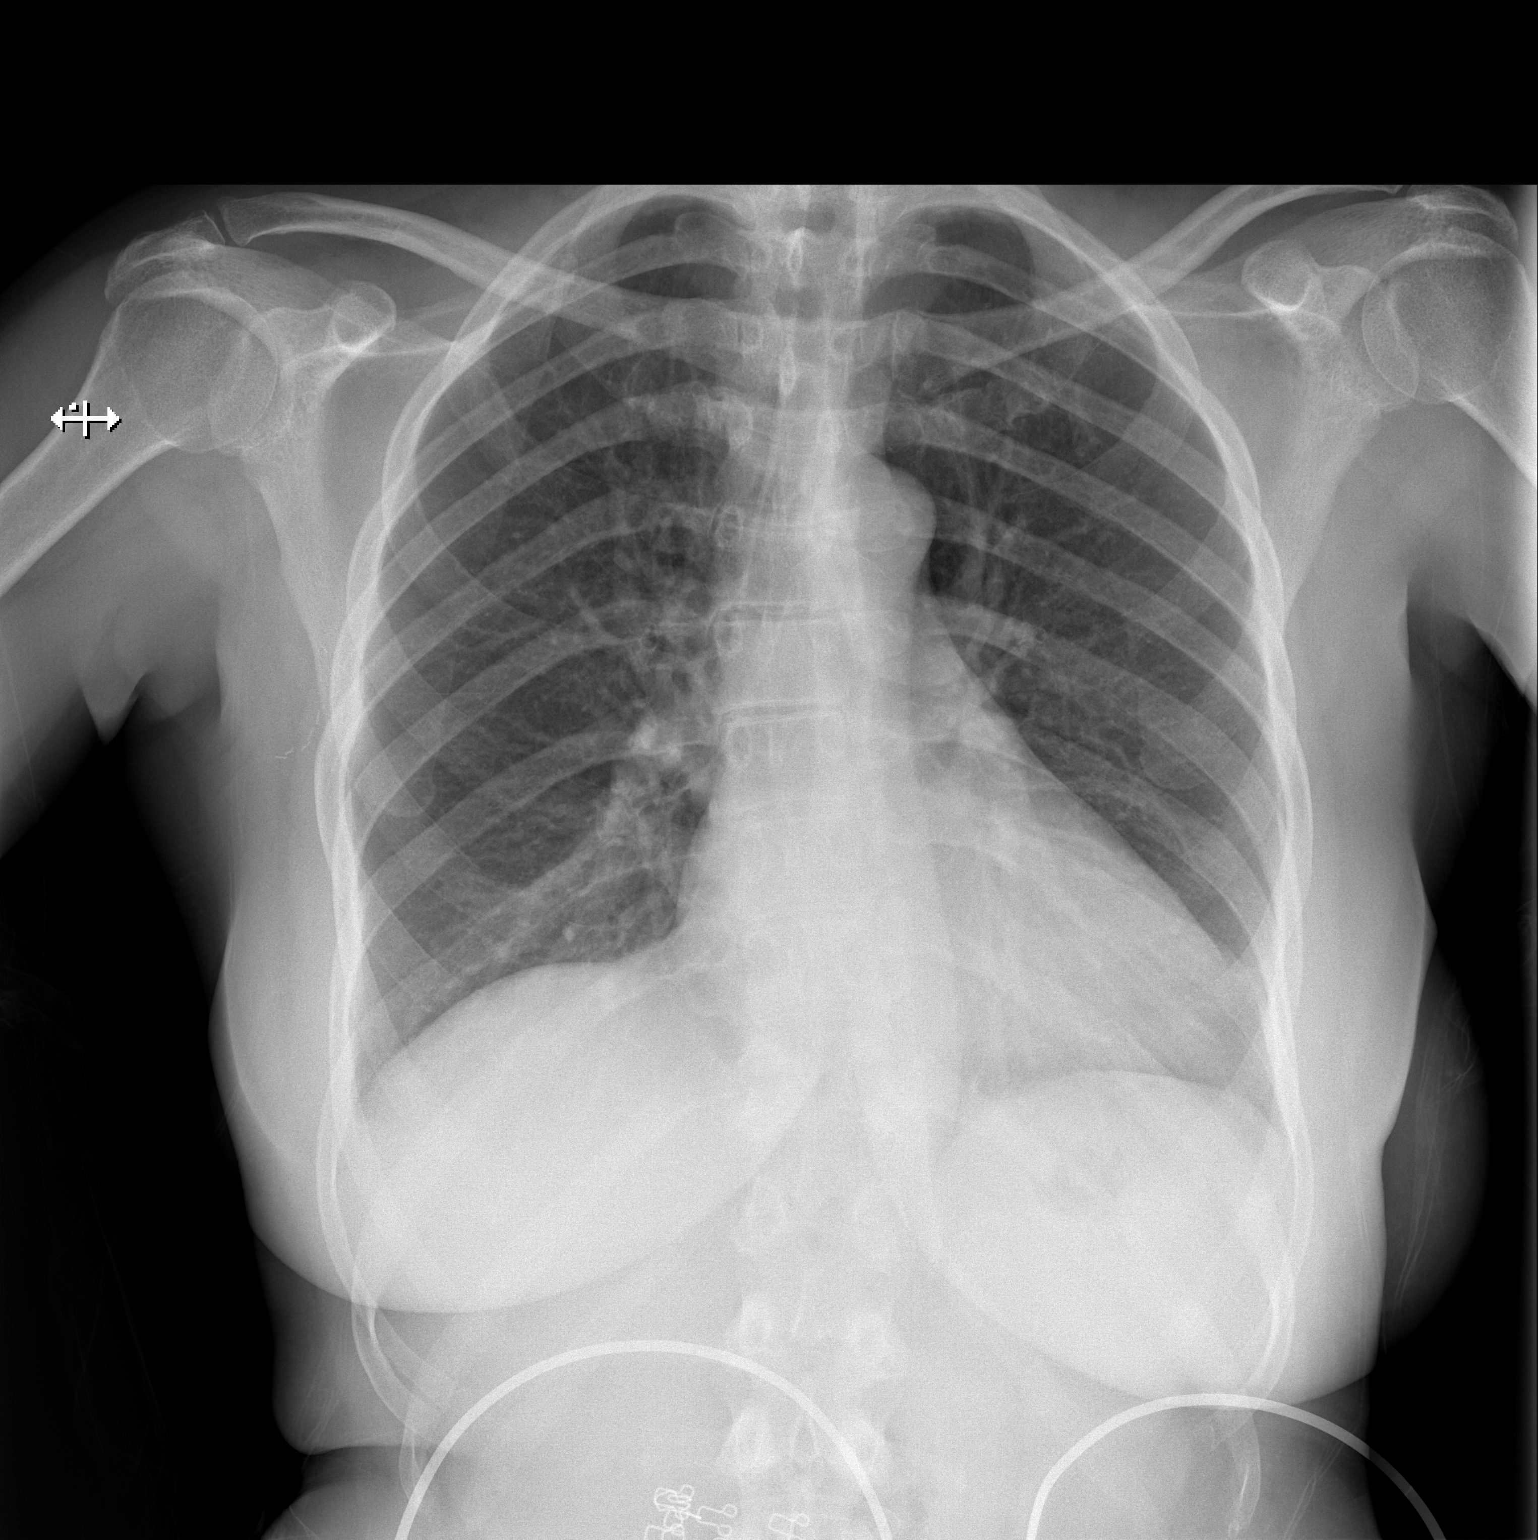

[w chest lat]
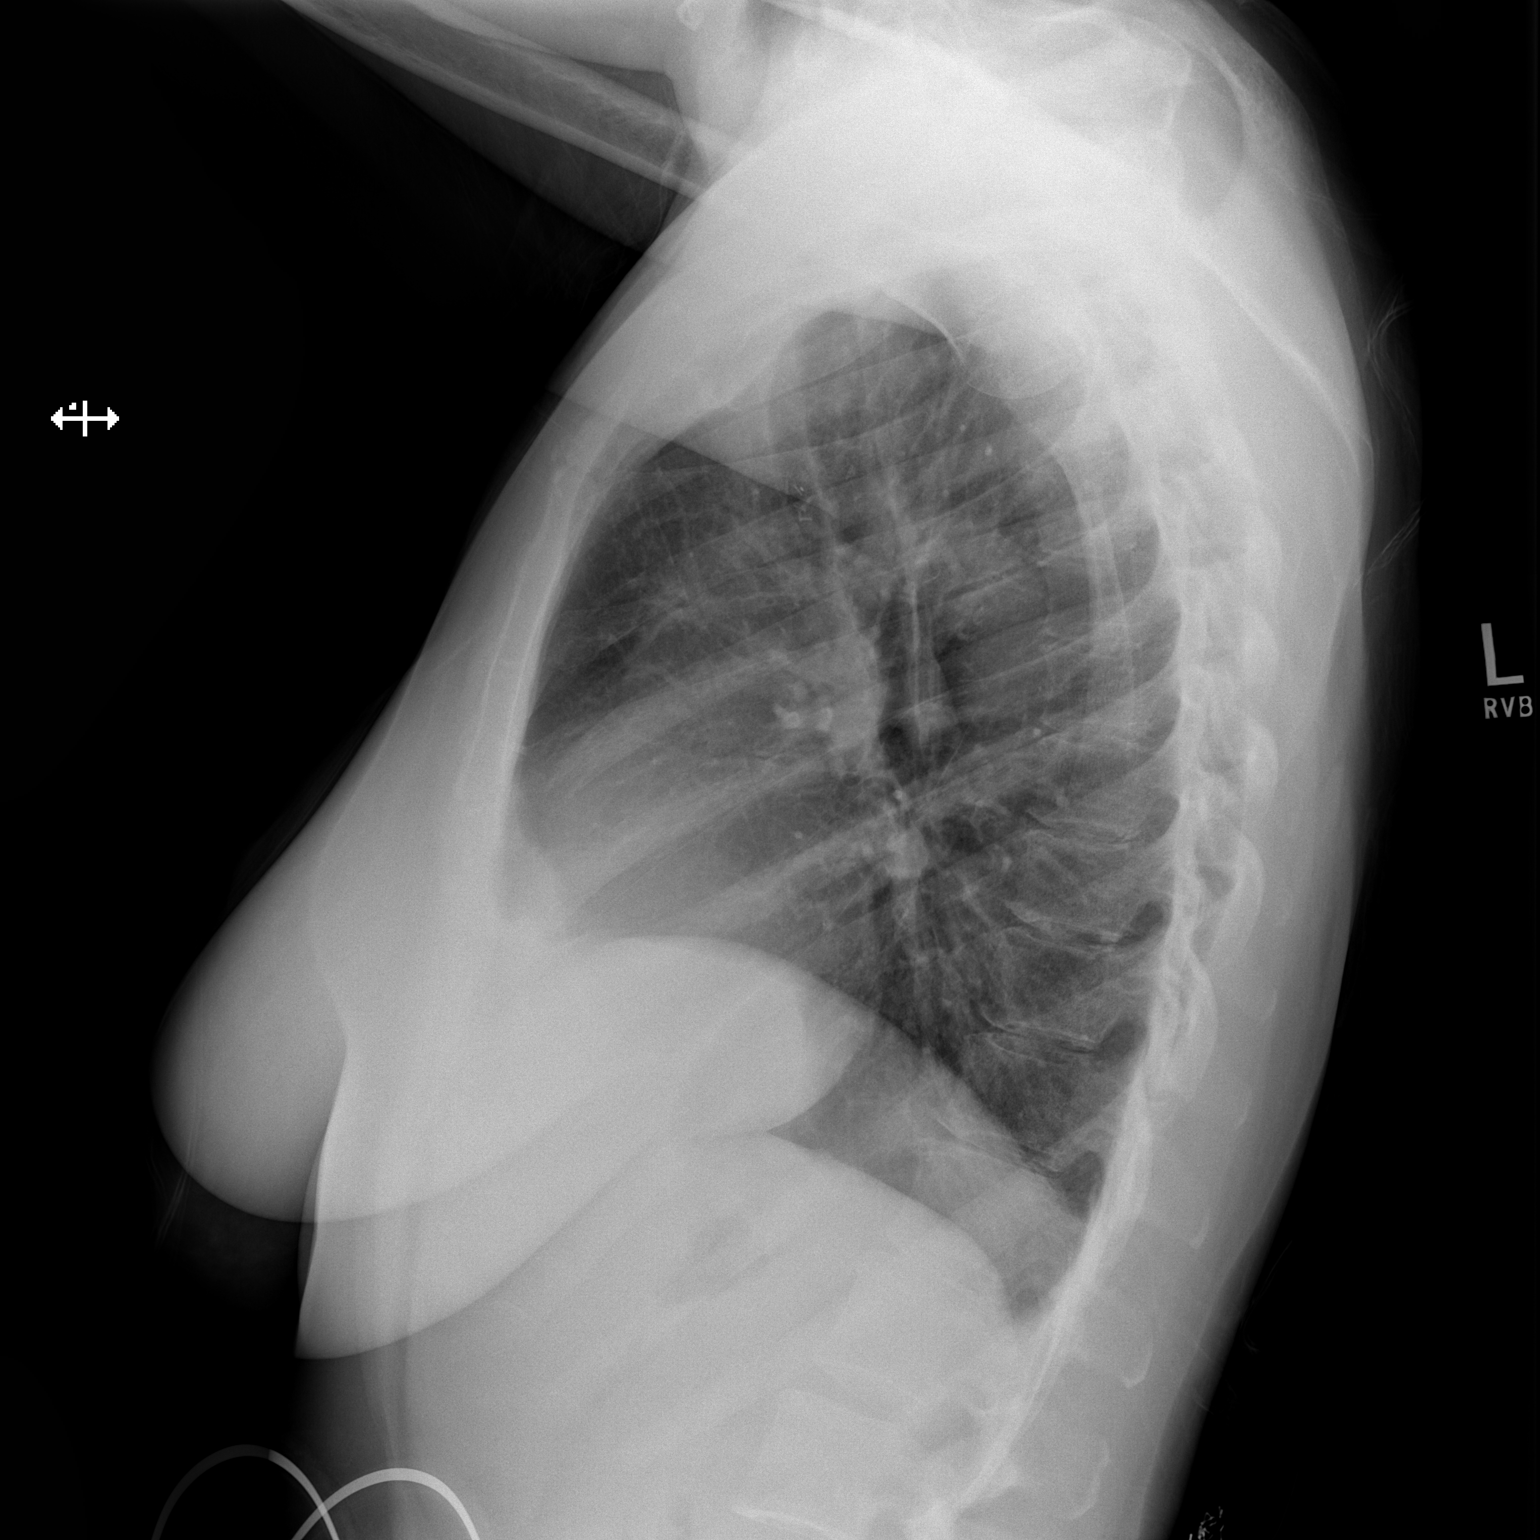

[2 of 2 positions shown; findings below may reference images not displayed]

FINDINGS: The cardiac silhouette remains borderline enlarged. Mild central
peribronchial thickening with mild progression. No airspace
consolidation. Minimal thoracic spine degenerative changes.
IMPRESSION: Mild bronchitic changes with mild progression.

## 2019-11-12 ENCOUNTER — Ambulatory Visit: Payer: BLUE CROSS/BLUE SHIELD | Attending: Internal Medicine

## 2019-11-12 DIAGNOSIS — Z23 Encounter for immunization: Secondary | ICD-10-CM | POA: Insufficient documentation

## 2019-11-12 NOTE — Progress Notes (Signed)
   Covid-19 Vaccination Clinic  Name:  Tammie Pham    MRN: [id number] DOB: May 20, 1966  11/12/2019  [name] was observed post Covid-19 immunization for 15 minutes without incidence. She was provided with Vaccine Information Sheet and instruction to access the V-Safe system.   [name] was instructed to call 911 with any severe reactions post vaccine: Marland Kitchen Difficulty breathing  . Swelling of your face and throat  . A fast heartbeat  . A bad rash all over your body  . Dizziness and weakness    Immunizations Administered    Name Date Dose VIS Date Route   Pfizer COVID-19 Vaccine 11/12/2019  1:21 PM 0.3 mL 08/26/2019 Intramuscular   Manufacturer: [place]   Lot: [id number]   [place]: [id number]

## 2019-12-10 ENCOUNTER — Ambulatory Visit: Payer: BLUE CROSS/BLUE SHIELD | Attending: Internal Medicine

## 2019-12-10 DIAGNOSIS — Z23 Encounter for immunization: Secondary | ICD-10-CM

## 2019-12-10 NOTE — Progress Notes (Signed)
   Covid-19 Vaccination Clinic  Name:  Tammie Pham    MRN: [id number] DOB: [birth year]  12/10/2019  [name] was observed post Covid-19 immunization for 15 minutes without incident. She was provided with Vaccine Information Sheet and instruction to access the V-Safe system.   [name] was instructed to call 911 with any severe reactions post vaccine: Marland Kitchen Difficulty breathing  . Swelling of face and throat  . A fast heartbeat  . A bad rash all over body  . Dizziness and weakness   Immunizations Administered    Name Date Dose VIS Date Route   Pfizer COVID-19 Vaccine 12/10/2019  2:14 PM 0.3 mL 08/26/2019 Intramuscular   Manufacturer: [employer]   Lot: [id number]   [place]: [id number]
